# Patient Record
Sex: Male | Born: 1950 | Race: White | Hispanic: No | Marital: Married | Smoking: Current every day smoker
Health system: Southern US, Community
[De-identification: ages and names within clinical notes are randomized; demographics above are authoritative.]

## PROBLEM LIST (undated history)

## (undated) DIAGNOSIS — E7800 Pure hypercholesterolemia, unspecified: Secondary | ICD-10-CM

## (undated) DIAGNOSIS — J449 Chronic obstructive pulmonary disease, unspecified: Secondary | ICD-10-CM

## (undated) DIAGNOSIS — E119 Type 2 diabetes mellitus without complications: Secondary | ICD-10-CM

## (undated) DIAGNOSIS — K219 Gastro-esophageal reflux disease without esophagitis: Secondary | ICD-10-CM

## (undated) DIAGNOSIS — E079 Disorder of thyroid, unspecified: Secondary | ICD-10-CM

## (undated) DIAGNOSIS — F32A Depression, unspecified: Secondary | ICD-10-CM

## (undated) DIAGNOSIS — F329 Major depressive disorder, single episode, unspecified: Secondary | ICD-10-CM

## (undated) HISTORY — PX: ORTHOPEDIC SURGERY: SHX850

---

## 2005-12-15 ENCOUNTER — Ambulatory Visit (HOSPITAL_COMMUNITY): Admission: RE | Admit: 2005-12-15 | Discharge: 2005-12-15 | Payer: Self-pay | Admitting: Orthopedic Surgery
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
## 2010-05-31 ENCOUNTER — Emergency Department (HOSPITAL_COMMUNITY)
Admission: EM | Admit: 2010-05-31 | Discharge: 2010-05-31 | Disposition: A | Discharge status: Home / Self Care | Payer: Medicare Other | Attending: Emergency Medicine | Admitting: Emergency Medicine

## 2010-05-31 DIAGNOSIS — F329 Major depressive disorder, single episode, unspecified: Secondary | ICD-10-CM | POA: Insufficient documentation

## 2010-05-31 DIAGNOSIS — I10 Essential (primary) hypertension: Secondary | ICD-10-CM | POA: Insufficient documentation

## 2010-05-31 DIAGNOSIS — M25429 Effusion, unspecified elbow: Secondary | ICD-10-CM | POA: Insufficient documentation

## 2010-05-31 DIAGNOSIS — J449 Chronic obstructive pulmonary disease, unspecified: Secondary | ICD-10-CM | POA: Insufficient documentation

## 2010-05-31 DIAGNOSIS — F3289 Other specified depressive episodes: Secondary | ICD-10-CM | POA: Insufficient documentation

## 2010-05-31 DIAGNOSIS — Z79899 Other long term (current) drug therapy: Secondary | ICD-10-CM | POA: Insufficient documentation

## 2010-05-31 DIAGNOSIS — E039 Hypothyroidism, unspecified: Secondary | ICD-10-CM | POA: Insufficient documentation

## 2010-05-31 DIAGNOSIS — M25529 Pain in unspecified elbow: Secondary | ICD-10-CM | POA: Insufficient documentation

## 2010-05-31 DIAGNOSIS — E119 Type 2 diabetes mellitus without complications: Secondary | ICD-10-CM | POA: Insufficient documentation

## 2010-05-31 DIAGNOSIS — Z8619 Personal history of other infectious and parasitic diseases: Secondary | ICD-10-CM | POA: Insufficient documentation

## 2010-05-31 DIAGNOSIS — M7020 Olecranon bursitis, unspecified elbow: Secondary | ICD-10-CM | POA: Insufficient documentation

## 2010-05-31 DIAGNOSIS — J4489 Other specified chronic obstructive pulmonary disease: Secondary | ICD-10-CM | POA: Insufficient documentation

## 2010-05-31 LAB — SYNOVIAL CELL COUNT + DIFF, W/ CRYSTALS
Crystals, Fluid: NONE SEEN
Eosinophils-Synovial: 1 % (ref 0–1)
Neutrophil, Synovial: 5 % (ref 0–25)
WBC, Synovial: 343 /mm3 — ABNORMAL HIGH (ref 0–200)

## 2010-06-04 LAB — BODY FLUID CULTURE: Culture: NO GROWTH

## 2011-05-06 ENCOUNTER — Ambulatory Visit (INDEPENDENT_AMBULATORY_CARE_PROVIDER_SITE_OTHER): Payer: Medicare Other | Admitting: General Surgery

## 2015-05-30 ENCOUNTER — Emergency Department (HOSPITAL_COMMUNITY): Payer: Medicare Other

## 2015-05-30 ENCOUNTER — Encounter (HOSPITAL_COMMUNITY): Payer: Self-pay

## 2015-05-30 ENCOUNTER — Emergency Department (HOSPITAL_COMMUNITY)
Admission: EM | Admit: 2015-05-30 | Discharge: 2015-05-31 | Disposition: A | Discharge status: Home / Self Care | Payer: Medicare Other | Attending: Emergency Medicine | Admitting: Emergency Medicine

## 2015-05-30 DIAGNOSIS — Z7984 Long term (current) use of oral hypoglycemic drugs: Secondary | ICD-10-CM | POA: Insufficient documentation

## 2015-05-30 DIAGNOSIS — E119 Type 2 diabetes mellitus without complications: Secondary | ICD-10-CM | POA: Diagnosis not present

## 2015-05-30 DIAGNOSIS — J189 Pneumonia, unspecified organism: Secondary | ICD-10-CM

## 2015-05-30 DIAGNOSIS — E079 Disorder of thyroid, unspecified: Secondary | ICD-10-CM | POA: Insufficient documentation

## 2015-05-30 DIAGNOSIS — K219 Gastro-esophageal reflux disease without esophagitis: Secondary | ICD-10-CM | POA: Diagnosis not present

## 2015-05-30 DIAGNOSIS — E7800 Pure hypercholesterolemia, unspecified: Secondary | ICD-10-CM | POA: Diagnosis not present

## 2015-05-30 DIAGNOSIS — F17210 Nicotine dependence, cigarettes, uncomplicated: Secondary | ICD-10-CM | POA: Diagnosis not present

## 2015-05-30 DIAGNOSIS — J441 Chronic obstructive pulmonary disease with (acute) exacerbation: Secondary | ICD-10-CM | POA: Diagnosis not present

## 2015-05-30 DIAGNOSIS — Z79899 Other long term (current) drug therapy: Secondary | ICD-10-CM | POA: Diagnosis not present

## 2015-05-30 DIAGNOSIS — R0602 Shortness of breath: Secondary | ICD-10-CM | POA: Diagnosis present

## 2015-05-30 DIAGNOSIS — Z791 Long term (current) use of non-steroidal anti-inflammatories (NSAID): Secondary | ICD-10-CM | POA: Diagnosis not present

## 2015-05-30 DIAGNOSIS — J159 Unspecified bacterial pneumonia: Secondary | ICD-10-CM | POA: Diagnosis not present

## 2015-05-30 HISTORY — DX: Pure hypercholesterolemia, unspecified: E78.00

## 2015-05-30 HISTORY — DX: Type 2 diabetes mellitus without complications: E11.9

## 2015-05-30 HISTORY — DX: Depression, unspecified: F32.A

## 2015-05-30 HISTORY — DX: Disorder of thyroid, unspecified: E07.9

## 2015-05-30 HISTORY — DX: Gastro-esophageal reflux disease without esophagitis: K21.9

## 2015-05-30 HISTORY — DX: Chronic obstructive pulmonary disease, unspecified: J44.9

## 2015-05-30 HISTORY — DX: Major depressive disorder, single episode, unspecified: F32.9

## 2015-05-30 LAB — COMPREHENSIVE METABOLIC PANEL
ALBUMIN: 4 g/dL (ref 3.5–5.0)
ALK PHOS: 97 U/L (ref 38–126)
ALT: 19 U/L (ref 17–63)
ANION GAP: 6 (ref 5–15)
AST: 19 U/L (ref 15–41)
BILIRUBIN TOTAL: 0.4 mg/dL (ref 0.3–1.2)
BUN: 14 mg/dL (ref 6–20)
CALCIUM: 9 mg/dL (ref 8.9–10.3)
CHLORIDE: 103 mmol/L (ref 101–111)
CO2: 29 mmol/L (ref 22–32)
CREATININE: 0.71 mg/dL (ref 0.61–1.24)
GFR calc Af Amer: >60 mL/min (ref >60)
GFR calc non Af Amer: >60 mL/min (ref >60)
GLUCOSE: 171 mg/dL — AB (ref 65–99)
Potassium: 4 mmol/L (ref 3.5–5.1)
Sodium: 138 mmol/L (ref 135–145)
Total Protein: 7.7 g/dL (ref 6.5–8.1)

## 2015-05-30 LAB — CBC
HCT: 44.5 % (ref 39.0–52.0)
HEMOGLOBIN: 14.5 g/dL (ref 13.0–17.0)
MCH: 31 pg (ref 26.0–34.0)
MCHC: 32.6 g/dL (ref 30.0–36.0)
MCV: 95.3 fL (ref 78.0–100.0)
Platelets: 164 10*3/uL (ref 150–400)
RBC: 4.67 MIL/uL (ref 4.22–5.81)
RDW: 14 % (ref 11.5–15.5)
WBC: 13.6 10*3/uL — ABNORMAL HIGH (ref 4.0–10.5)

## 2015-05-30 LAB — URINALYSIS, ROUTINE W REFLEX MICROSCOPIC
BILIRUBIN URINE: NEGATIVE
Glucose, UA: NEGATIVE mg/dL
Hgb urine dipstick: NEGATIVE
Ketones, ur: NEGATIVE mg/dL
Leukocytes, UA: NEGATIVE
NITRITE: NEGATIVE
PH: 6 (ref 5.0–8.0)
Protein, ur: NEGATIVE mg/dL
SPECIFIC GRAVITY, URINE: 1.02 (ref 1.005–1.030)

## 2015-05-30 LAB — I-STAT TROPONIN, ED: TROPONIN I, POC: 0 ng/mL (ref 0.00–0.08)

## 2015-05-30 LAB — BRAIN NATRIURETIC PEPTIDE: B Natriuretic Peptide: 34 pg/mL (ref 0.0–100.0)

## 2015-05-30 MED ORDER — MAGNESIUM SULFATE 2 GM/50ML IV SOLN
2.0000 g | Freq: Once | INTRAVENOUS | Status: AC
  Administered 2015-05-30: 2 g via INTRAVENOUS
  Filled 2015-05-30: qty 50

## 2015-05-30 MED ORDER — AZITHROMYCIN 250 MG PO TABS
500.0000 mg | ORAL_TABLET | Freq: Once | ORAL | Status: AC
  Administered 2015-05-31: 500 mg via ORAL
  Filled 2015-05-30: qty 2

## 2015-05-30 MED ORDER — FUROSEMIDE 10 MG/ML IJ SOLN
20.0000 mg | Freq: Once | INTRAMUSCULAR | Status: AC
  Administered 2015-05-31: 20 mg via INTRAVENOUS
  Filled 2015-05-30: qty 2

## 2015-05-30 MED ORDER — ALBUTEROL (5 MG/ML) CONTINUOUS INHALATION SOLN
10.0000 mg/h | INHALATION_SOLUTION | Freq: Once | RESPIRATORY_TRACT | Status: AC
  Administered 2015-05-30: 10 mg/h via RESPIRATORY_TRACT
  Filled 2015-05-30: qty 20

## 2015-05-30 NOTE — ED Notes (Signed)
CBG 188 per ems

## 2015-05-30 NOTE — ED Provider Notes (Signed)
CSN: [id number]     Arrival date & time 05/30/15  2031 History  By signing my name below, I, Linus Galas, attest that this documentation has been prepared under the direction and in the presence of No att. providers found. Electronically Signed: Linus Galas, ED Scribe. 05/31/2015. 9:08 PM.   Chief Complaint  Patient presents with  . Shortness of Breath   The history is provided by the patient. No language interpreter was used.   HPI Comments: Marc Thomas is a 65 y.o. male with a PMHx of COPD, HLD, DM, GERD, thyroid disease and depression who presents to the Emergency Department complaining of SOB for several weeks worsening today, PTA.  Pt also reports pleuritic chest pain and cough. SOB exacerbated with inspiration. Pt reports he uses an inhaler and oxygen (4L at home at night). Pt denies any fevers, chills, nausea, vomiting, or any other symptoms at this time. Pt is a disabled veteran.   Past Medical History  Diagnosis Date  . COPD (chronic obstructive pulmonary disease) (HCC)   . Thyroid disease   . Depression   . GERD (gastroesophageal reflux disease)   . High cholesterol   . Diabetes mellitus without complication [facility])    Past Surgical History  Procedure Laterality Date  . Orthopedic surgery     History reviewed. No pertinent family history. Social History  Substance Use Topics  . Smoking status: Current Every Day Smoker -- 1.00 packs/day    Types: Cigarettes  . Smokeless tobacco: None  . Alcohol Use: No    Review of Systems  Constitutional: Negative for fever, chills and activity change.  HENT: Negative for congestion, facial swelling and rhinorrhea.   Eyes: Negative for discharge and visual disturbance.  Respiratory: Positive for cough, chest tightness and shortness of breath.   Cardiovascular: Negative for chest pain and palpitations.  Gastrointestinal: Negative for vomiting, abdominal pain, diarrhea and constipation.  Endocrine: Negative for polyuria.   Genitourinary: Negative for dysuria and flank pain.  Musculoskeletal: Negative for myalgias, back pain, arthralgias and neck pain.  Skin: Negative for color change and wound.  Neurological: Negative for tremors, syncope and headaches.  Psychiatric/Behavioral: Negative for confusion and dysphoric mood.  All other systems reviewed and are negative.  Allergies  Review of patient's allergies indicates no known allergies.  Home Medications   Prior to Admission medications   Medication Sig Start Date End Date Taking? Authorizing Provider  celecoxib (CELEBREX) 200 MG capsule Take 200 mg by mouth 2 (two) times daily.   Yes Historical Provider, MD  DULoxetine (CYMBALTA) 60 MG capsule Take 60 mg by mouth 2 (two) times daily.   Yes Historical Provider, MD  gabapentin (NEURONTIN) 300 MG capsule Take 600 mg by mouth 3 (three) times daily.   Yes Historical Provider, MD  levothyroxine (SYNTHROID, LEVOTHROID) 50 MCG tablet Take 50 mcg by mouth daily before breakfast.   Yes Historical Provider, MD  omeprazole (PRILOSEC) 20 MG capsule Take 20 mg by mouth daily.   Yes Historical Provider, MD  pravastatin (PRAVACHOL) 40 MG tablet Take 40 mg by mouth at bedtime.   Yes Historical Provider, MD  QUEtiapine (SEROQUEL) 400 MG tablet Take 800 mg by mouth at bedtime.   Yes Historical Provider, MD  sertraline (ZOLOFT) 100 MG tablet Take 50 mg by mouth 2 (two) times daily.   Yes Historical Provider, MD  sitaGLIPtin (JANUVIA) 100 MG tablet Take 100 mg by mouth daily.   Yes Historical Provider, MD  triazolam (HALCION) 0.25 MG tablet Take  0.25 mg by mouth at bedtime.   Yes Historical Provider, MD  azithromycin (ZITHROMAX) 250 MG tablet One daily until gone 05/31/15   Marily Memos, MD  predniSONE (DELTASONE) 20 MG tablet Take 2 tablets (40 mg total) by mouth once. 05/31/15   Marily Memos, MD  topiramate (TOPAMAX) 25 MG tablet Take 25-50 mg by mouth as needed. headache 05/03/15   Historical Provider, MD   BP 103/89 mmHg   Pulse 105  Temp(Src) 99.7 F (37.6 C) (Oral)  Resp 17  Ht  (1.702 m)  Wt 180 lb 3.2 oz (81.738 kg)  BMI 28.22 kg/m2  SpO2 96%   Physical Exam  Constitutional: He is oriented to person, place, and time. He appears well-developed and well-nourished.  HENT:  Head: Normocephalic and atraumatic.  Cardiovascular: Normal rate.   Pulmonary/Chest:  Decreased breath sounds bilaterally; expiratory wheezes bilaterally; diffuse chest wall tenderness  Abdominal: He exhibits no distension.  Musculoskeletal: He exhibits no edema.  Neurological: He is alert and oriented to person, place, and time.  Skin: Skin is warm and dry.  Psychiatric: He has a normal mood and affect.  Nursing note and vitals reviewed.   ED Course  Procedures   DIAGNOSTIC STUDIES: Oxygen Saturation is 100% on room air, normal by my interpretation.    COORDINATION OF CARE: 9:05 PM Will give breathing treatment. Will order CXR, blood work, urinalysis, and EKG. Discussed treatment plan with pt at bedside and pt agreed to plan.   Labs Review Labs Reviewed  COMPREHENSIVE METABOLIC PANEL - Abnormal; Notable for the following:    Glucose, Bld 171 (*)    All other components within normal limits  CBC - Abnormal; Notable for the following:    WBC 13.6 (*)    All other components within normal limits  URINALYSIS, ROUTINE W REFLEX MICROSCOPIC (NOT AT [facility])  BRAIN NATRIURETIC PEPTIDE  Rosezena Sensor, ED    Imaging Review Dg Chest Port 1 View  05/30/2015  CLINICAL DATA:  Shortness of breath for several weeks worsening today. EXAM: PORTABLE CHEST 1 VIEW COMPARISON:  None. FINDINGS: 2217 hours. Kyphotic positioning with face obscuring the lung apices. The cardio pericardial silhouette is enlarged. Diffuse bilateral airspace disease suggest pulmonary edema. The visualized bony structures of the thorax are intact. Telemetry leads overlie the chest. IMPRESSION: Cardiomegaly with diffuse bilateral airspace disease suggesting  pulmonary edema. Diffuse infection would also be a consideration. Electronically Signed   By: Kennith Center M.D.   On: 05/30/2015 22:45   I have personally reviewed and evaluated these images and lab results as part of my medical decision-making.   EKG Interpretation   Date/Time:  Wednesday May 30 2015 20:40:26 EST Ventricular Rate:  96 PR Interval:  186 QRS Duration: 93 QT Interval:  367 QTC Calculation: 464 R Axis:   87 Text Interpretation:  Sinus rhythm Borderline right axis deviation  Confirmed by Naijah Lacek MD, Barbara Cower [phone]) on 05/30/2015 9:35:03 PM      MDM   Final diagnoses:  CAP (community acquired pneumonia)  COPD exacerbation (HCC)    65 yo M here with worsening dyspnea over last few months of uncertain etiology. No clear evidence of heart failure (exam not c/w CXR). Duo nebs and steroids not helpful making copd exacerbation less likely. No fever, worsening cough to suggest that pneumonia is the cause. Patient able to ambulate around the department, with baseline dyspnea which is admittedly pretty severe, however he states it is his baseline. CXR with some time of interstitial pattern  compared to 2007. Infectious v cardiogenic. Will give dose of lasix here, continue steroids at home, start on abx and [facility] for further evaluation and possible workup    I personally performed the services described in this documentation, which was scribed in my presence. The recorded information has been reviewed and is accurate.     Marily Memos, MD 05/31/15 1116

## 2015-05-30 NOTE — ED Notes (Signed)
Patient c/o increased shortness of breath and cough started 3 days ago. Patient also states chest pain with exertion, and cough. Patient has history of COPD and home O2use at night.

## 2015-05-31 DIAGNOSIS — J159 Unspecified bacterial pneumonia: Secondary | ICD-10-CM | POA: Diagnosis not present

## 2015-05-31 MED ORDER — PREDNISONE 20 MG PO TABS
40.0000 mg | ORAL_TABLET | Freq: Once | ORAL | Status: AC
  Administered 2015-05-31: 40 mg via ORAL
  Filled 2015-05-31: qty 2

## 2015-05-31 MED ORDER — AZITHROMYCIN 250 MG PO TABS: ORAL_TABLET | ORAL | Status: AC

## 2015-05-31 MED ORDER — PREDNISONE 20 MG PO TABS: 40.0000 mg | ORAL_TABLET | Freq: Once | ORAL | Status: AC

## 2017-06-05 IMAGING — CR DG CHEST 1V PORT
1 series · 1 of 1 positions shown · non-contrast
Comparison: None.

CLINICAL DATA: Shortness of breath for several weeks worsening
today.

EXAM:
PORTABLE CHEST 1 VIEW

[ap]
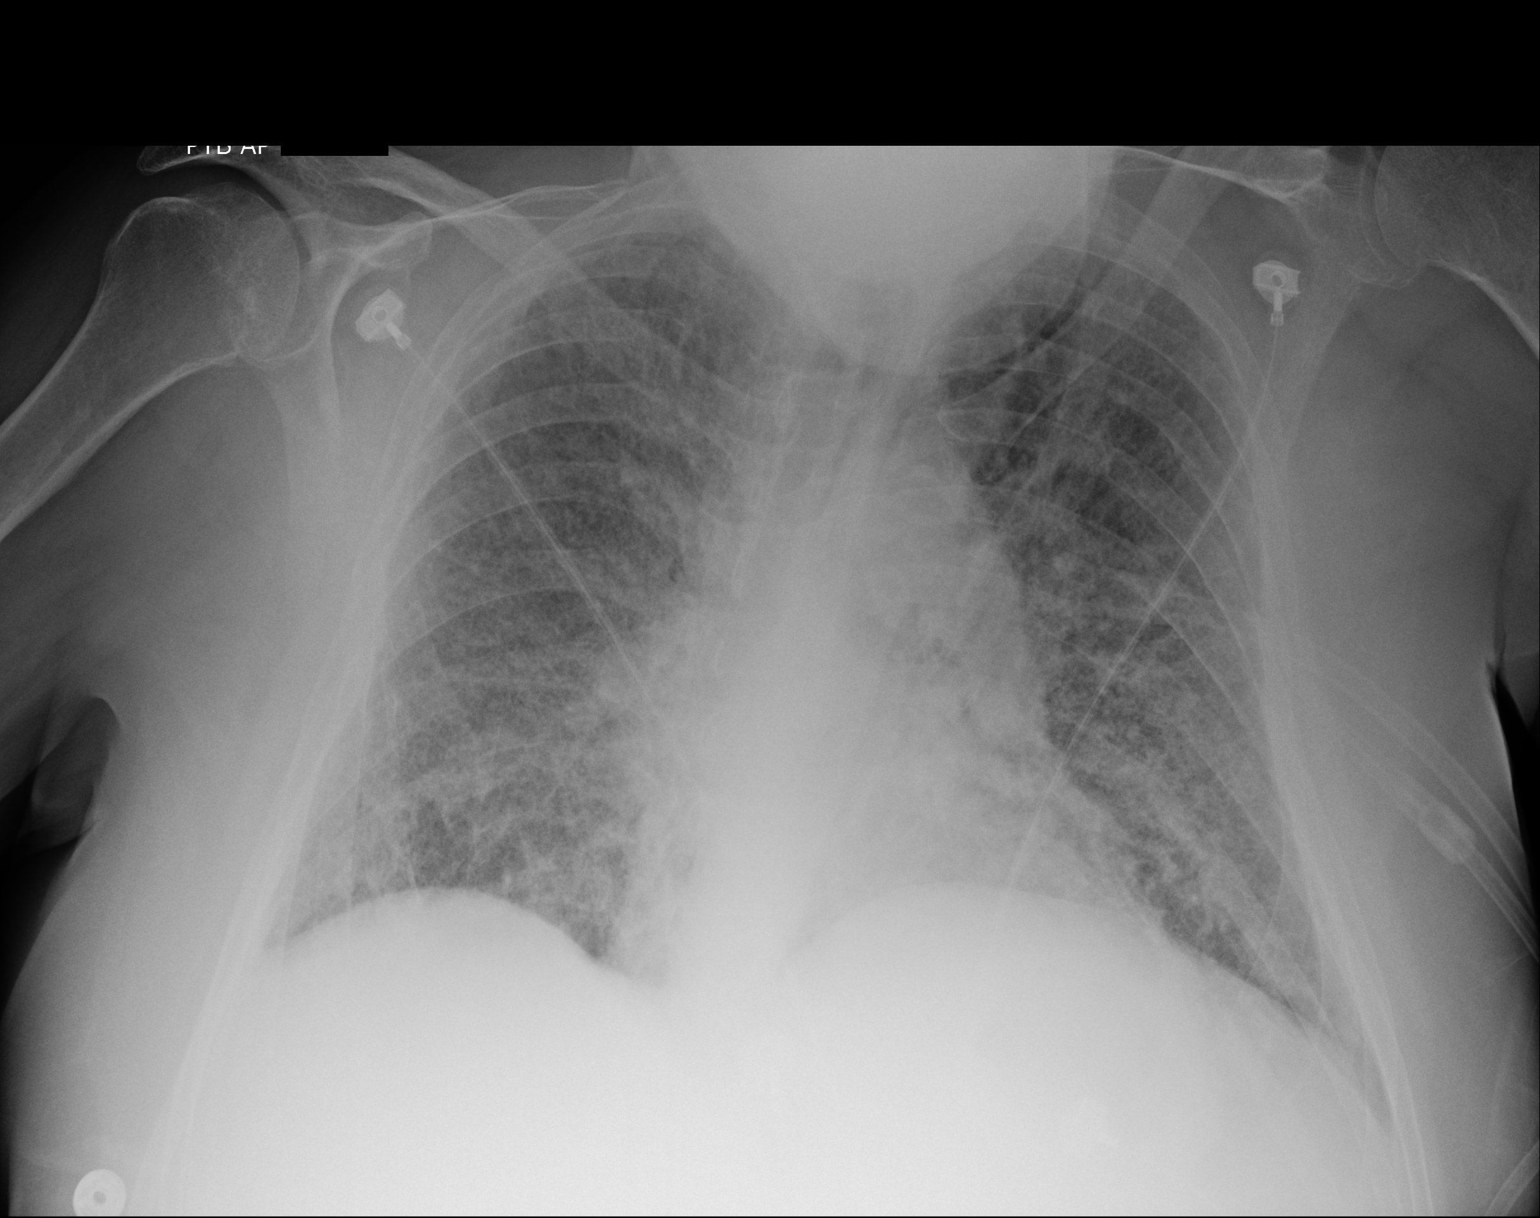

[1 of 1 positions shown; findings below may reference images not displayed]

FINDINGS: 4450 hours. Kyphotic positioning with face obscuring the lung
apices. The cardio pericardial silhouette is enlarged. Diffuse
bilateral airspace disease suggest pulmonary edema. The visualized
bony structures of the thorax are intact. Telemetry leads overlie
the chest.
IMPRESSION: Cardiomegaly with diffuse bilateral airspace disease suggesting
pulmonary edema. Diffuse infection would also be a consideration.

## 2021-12-06 DEATH — deceased
